# Patient Record
Sex: Male | Born: 2000 | Race: White | Hispanic: No | Marital: Single | Smoking: Never smoker
Health system: Southern US, Community
[De-identification: ages and names within clinical notes are randomized; demographics above are authoritative.]

## PROBLEM LIST (undated history)

## (undated) DIAGNOSIS — G43909 Migraine, unspecified, not intractable, without status migrainosus: Secondary | ICD-10-CM

## (undated) HISTORY — PX: HYPOSPADIAS CORRECTION: SHX483

---
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
## 2018-02-20 ENCOUNTER — Encounter: Payer: Self-pay | Admitting: Family Medicine

## 2018-02-20 ENCOUNTER — Ambulatory Visit (INDEPENDENT_AMBULATORY_CARE_PROVIDER_SITE_OTHER): Payer: 59 | Admitting: Family Medicine

## 2018-02-20 VITALS — BP 121/82 | HR 67 | Wt 291.0 lb

## 2018-02-20 DIAGNOSIS — M79672 Pain in left foot: Secondary | ICD-10-CM | POA: Diagnosis not present

## 2018-02-20 DIAGNOSIS — S92335G Nondisplaced fracture of third metatarsal bone, left foot, subsequent encounter for fracture with delayed healing: Secondary | ICD-10-CM

## 2018-02-20 DIAGNOSIS — S92245G Nondisplaced fracture of medial cuneiform of left foot, subsequent encounter for fracture with delayed healing: Secondary | ICD-10-CM

## 2018-02-20 MED ORDER — DULOXETINE HCL 30 MG PO CPEP: 30.0000 mg | ORAL_CAPSULE | Freq: Every day | ORAL | 2 refills | Status: DC

## 2018-02-20 NOTE — Progress Notes (Signed)
Jamie Kelly is a 17 y.o. male who presents to [facility] today for left foot pain.   Jamie Kelly is a long history of left foot pain starting in August 2018.  A foot landed on the dorsal aspect of his foot resulting in impact injury.  X-rays were initially negative.  Eventually in October an MRI showed fracture of the third metatarsal and medial cuneiform.  He continued to have immobilization until his MRI was rechecked and January showing healing.  He continued to have pain and skin changes and I was suspicious for complex regional pain syndrome.  He had some benefit with physical therapy and adjunct medications including gabapentin and nortriptyline.  Since I last saw him April 2019 he plateaued with physical therapy and stopped going in May.  Additionally he notes that he finds the nortriptyline obnoxious and the gabapentin not very helpful in the long run.  He continues to experience pain in his foot especially with activity.  His pain is limiting his ability to work normally.     ROS:  As above  Exam:  BP 121/82   Pulse 67   Wt 291 lb (132 kg)   BMI 39.47 kg/m  General: Well Developed, well nourished, and in no acute distress.  Neuro/Psych: Alert and oriented x3, extra-ocular muscles intact, able to move all 4 extremities, sensation grossly intact. Skin: Warm and dry, no rashes noted.  Respiratory: Not using accessory muscles, speaking in full sentences, trachea midline.  Cardiovascular: Pulses palpable, no extremity edema. Abdomen: Does not appear distended. Left foot: no swelling, bruising or abrasions.  Tender to palpation over the dorsal foot 2+DP pulse Antalgic gait present. No significant skin changes present today.    Lab and Radiology Results EXAM: MRI OF THE LEFT FOOT WITHOUT CONTRAST  TECHNIQUE: Multiplanar, multisequence MR imaging of the left forefoot was performed. No intravenous contrast was  administered.  COMPARISON:  Radiographs 08/01/2017.  MRI 05/16/2017.  FINDINGS: Bones/Joint/Cartilage  Healing fractures of the medial cuneiform and 3rd metatarsal base. Nondisplaced fracture lines remain within the medial cuneiform with improved surrounding edema. The 3rd metatarsal fracture has completely healed. No significant residual signal abnormalities within the 2nd metatarsal base or middle cuneiform. No acute osseous findings. No significant joint effusions.  Ligaments  As on previous study, the intraosseous portion of the Lisfranc ligament is not well visualized and may be torn. The alignment remains anatomic at the Lisfranc joint.  Muscles and Tendons  The forefoot muscles and tendons appear normal. No significant tenosynovitis.  Soft tissues  No focal soft tissue abnormalities.  IMPRESSION: 1. Healing fractures of the medial cuneiform and 3rd metatarsal base. There is some residual marrow edema in the medial cuneiform which may indicate incomplete healing. No fracture displacement identified. 2. No new osseous findings. 3. Poor visualization of the intraosseous component of the Lisfranc ligament as on previous study. No demonstrated subluxation at the Lisfranc joint.   Electronically Signed   By: Carey BullocksWilliam  Veazey M.D.   On: 08/21/2017 10:23 .xrya   Assessment and Plan: 17 y.o. male with left foot pain. Been ongoing now for almost a year.  Patient was treated conservatively and adequately for typical injury pattern.  However despite immobilization and physical therapy he continues to have significant quality of life limiting pain.  At last imaging check in July 2019 he had healing on MRI.  He continues to have significant pain now 6 months later.  I am not sure exactly why he continues  to hurt.  It is possible he does have complex regional pain syndrome versus other etiology.  I think it is reasonable to get a second opinion.  I recommend  referral to speak surgery specializing in foot and ankle surgery for second opinion and repeat evaluation.  Is possible missing something.  As I anticipate orthopedics will get x-rays regardless if I get them today I think is reasonable to hold off on x-rays and await orthopedic x-ray imaging.  I think is also reasonable or likely that he will get a three-phase bone scan to rule in or out complex regional pain syndrome.  However I do think a second opinion makes a lot of sense at this point.  Is also reasonable to continue to try other medications.  We will discontinue nortriptyline and gabapentin and do a trial of Cymbalta.  Recheck as needed.  I spent 25 minutes with this patient, greater than 50% was face-to-face time counseling regarding ddx and plan.    Orders Placed This Encounter  Procedures  . Ambulatory referral to Orthopedic Surgery    Referral Priority:   Routine    Referral Type:   Surgical    Referral Reason:   Specialty Services Required    Requested Specialty:   Orthopedic Surgery    Number of Visits Requested:   1   Meds ordered this encounter  Medications  . DULoxetine (CYMBALTA) 30 MG capsule    Sig: Take 1 capsule (30 mg total) by mouth daily.    Dispense:  30 capsule    Refill:  2    Historical information moved to improve visibility of documentation.  No past medical history on file. No past surgical history on file. Social History   Tobacco Use  . Smoking status: Never Smoker  . Smokeless tobacco: Never Used  Substance Use Topics  . Alcohol use: No   family history is not on file.  Medications: Current Outpatient Medications  Medication Sig Dispense Refill  . DULoxetine (CYMBALTA) 30 MG capsule Take 1 capsule (30 mg total) by mouth daily. 30 capsule 2   No current facility-administered medications for this visit.    No Known Allergies    Discussed warning signs or symptoms. Please see discharge instructions. Patient expresses understanding.  I  personally was present and performed or re-performed the history, physical exam and medical decision-making activities of this service and have verified that the service and findings are accurately documented in the student's note. ___________________________________________ Clementeen Graham M.D., ABFM., CAQSM. Primary Care and Sports Medicine Adjunct Instructor of Family Medicine  [employer]

## 2018-02-20 NOTE — Patient Instructions (Signed)
Thank you for coming in today. Use cymblata daily.  Follow up with Orthopedics for 2nd opinion.  Recheck as needed.  Let me know if cymbalta helps.   Try baclofen for cramps.

## 2018-06-14 ENCOUNTER — Other Ambulatory Visit: Payer: Self-pay | Admitting: Orthopedic Surgery

## 2018-06-14 DIAGNOSIS — S92245K Nondisplaced fracture of medial cuneiform of left foot, subsequent encounter for fracture with nonunion: Secondary | ICD-10-CM

## 2018-06-19 ENCOUNTER — Ambulatory Visit
Admission: RE | Admit: 2018-06-19 | Discharge: 2018-06-19 | Disposition: A | Discharge status: Home / Self Care | Payer: 59 | Source: Ambulatory Visit | Attending: Orthopedic Surgery | Admitting: Orthopedic Surgery

## 2018-06-19 DIAGNOSIS — S92245K Nondisplaced fracture of medial cuneiform of left foot, subsequent encounter for fracture with nonunion: Secondary | ICD-10-CM

## 2018-07-06 ENCOUNTER — Telehealth: Payer: Self-pay | Admitting: Family Medicine

## 2018-07-06 NOTE — Telephone Encounter (Signed)
Received documentation from Dr[facility] Victorino [facility]ewitt [facility]  Patient continues to experience foot pain and had a recent CT scan of his foot showing a healed medial cuneiform fracture with no evidence of nonunion.  No degenerative changes some irregularity at medial cuneiform.  Plan for diagnostic and therapeutic injection.  Note will be sent to scan.

## 2018-07-31 IMAGING — MR MR FOOT*L* W/O CM
5 series · 40 of 40 positions shown · non-contrast
Comparison: Left foot x-rays dated May 05, 2017.

CLINICAL DATA: Left foot pain since a brick wall fell on his foot
and [place]. Evaluate for Lisfranc injury.

EXAM:
MRI OF THE LEFT FOOT WITHOUT CONTRAST
TECHNIQUE: Multiplanar, multisequence MR imaging of the left foot was
performed. No intravenous contrast was administered.

[Series 4: T1 · coronal · 3.5mm · 0.56mm/px · 11 of 53 slices shown (1 of 2)]
[im 1/53]
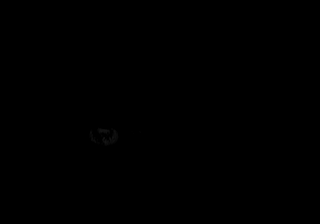
[im 6/53]
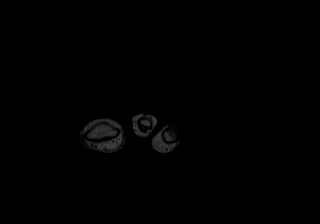
[im 11/53]
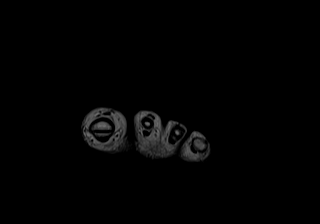
[im 16/53]
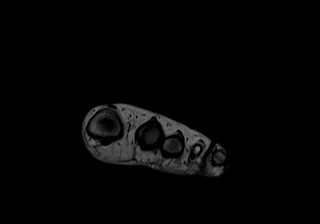
[im 21/53]
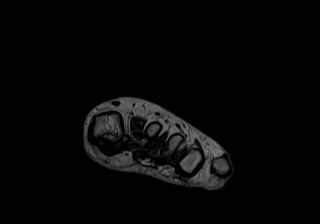
[im 27/53]
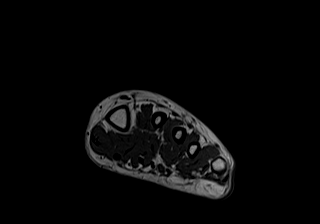
[im 32/53]
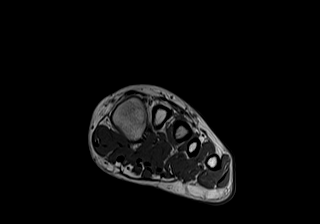
[im 37/53]
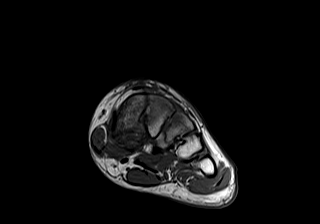
[im 42/53]
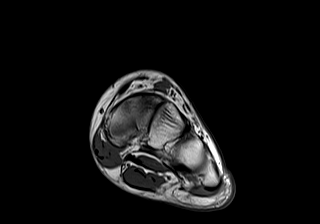
[im 47/53]
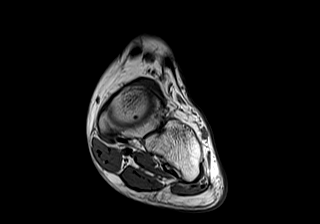
[im 53/53]
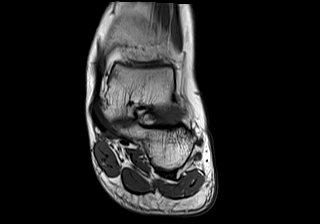

[Series 5: T2 fat-sat · coronal · 3.5mm · 0.56mm/px · 11 of 53 slices shown (1 of 3)]
[im 1/53]
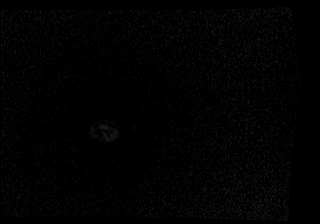
[im 6/53]
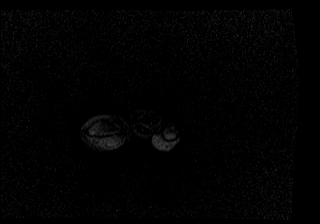
[im 11/53]
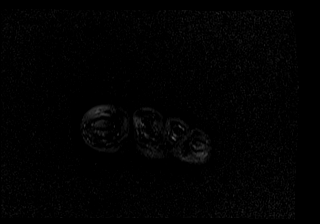
[im 16/53]
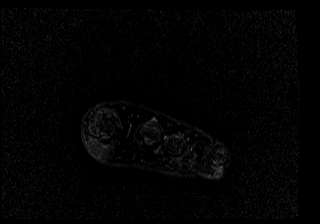
[im 21/53]
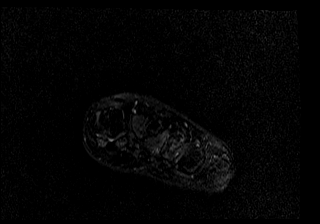
[im 27/53]
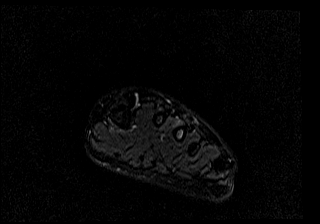
[im 32/53]
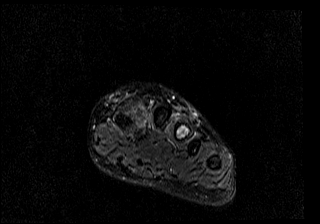
[im 37/53]
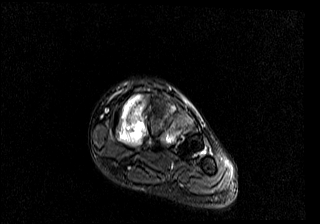
[im 42/53]
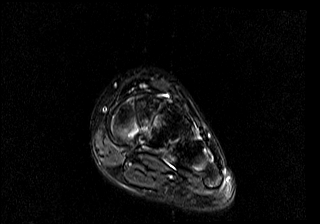
[im 47/53]
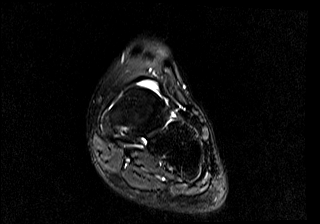
[im 53/53]
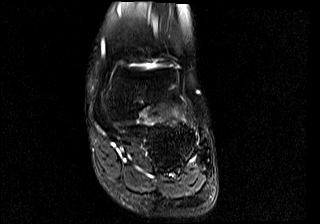

[Series 6: T1 · axial · 3.0mm · 0.62mm/px · z∈[-98,-13]mm · 6 of 28 slices shown (2 of 2)]
[im 1/28]
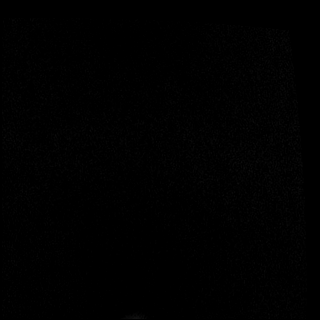
[im 6/28]
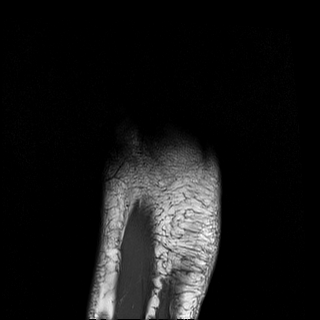
[im 11/28]
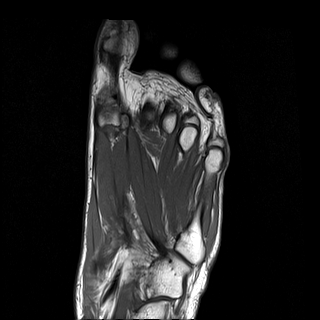
[im 17/28]
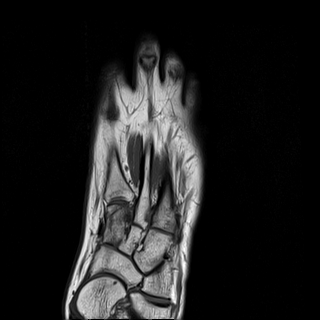
[im 22/28]
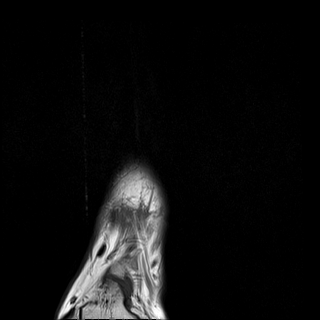
[im 28/28]
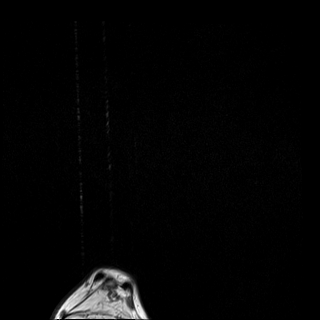

[Series 7: T2 fat-sat · axial · 3.0mm · 0.78mm/px · z∈[-98,-13]mm · 6 of 28 slices shown (2 of 3)]
[im 1/28]
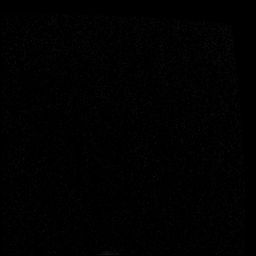
[im 6/28]
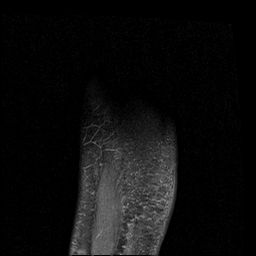
[im 11/28]
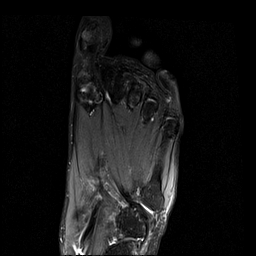
[im 17/28]
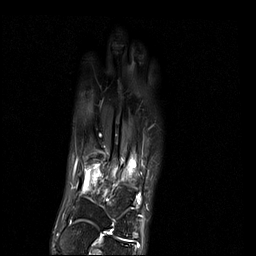
[im 22/28]
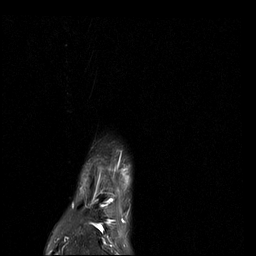
[im 28/28]
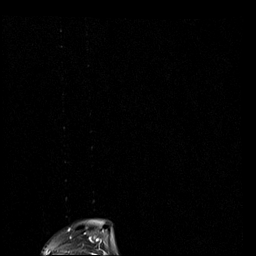

[Series 8: T2 fat-sat · sagittal · 3.0mm · 0.39mm/px · 6 of 31 slices shown (3 of 3)]
[im 1/31]
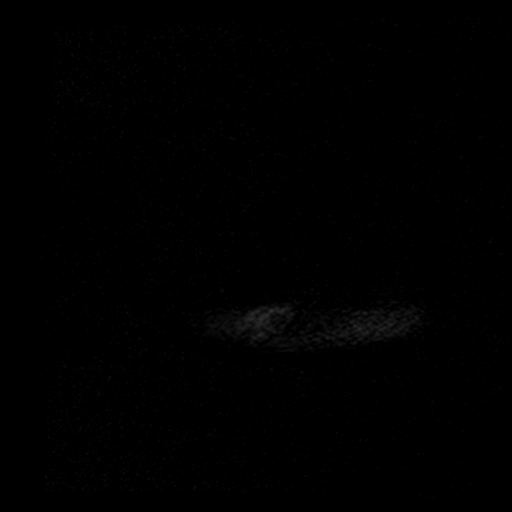
[im 7/31]
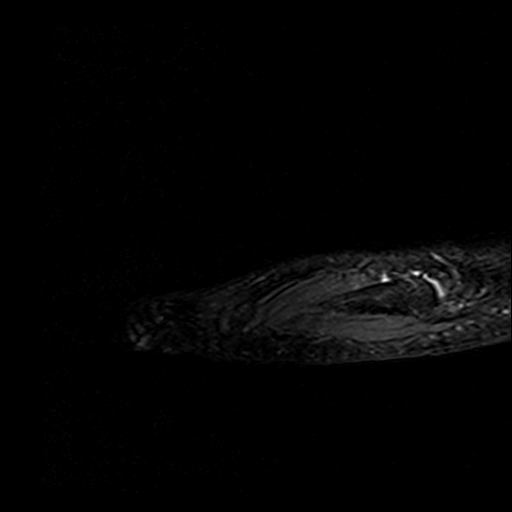
[im 13/31]
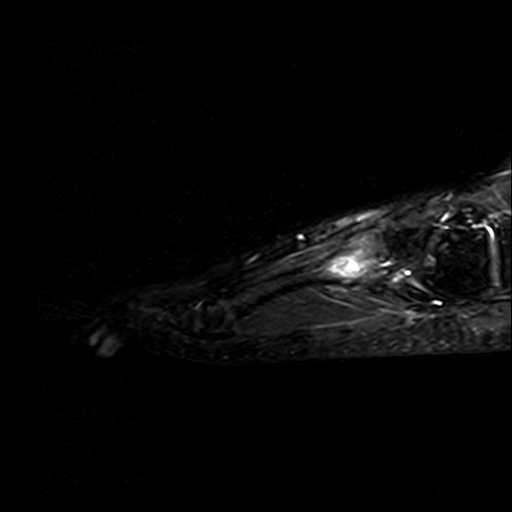
[im 19/31]
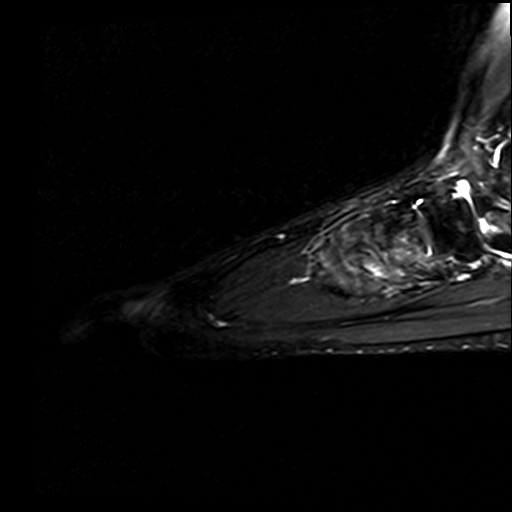
[im 25/31]
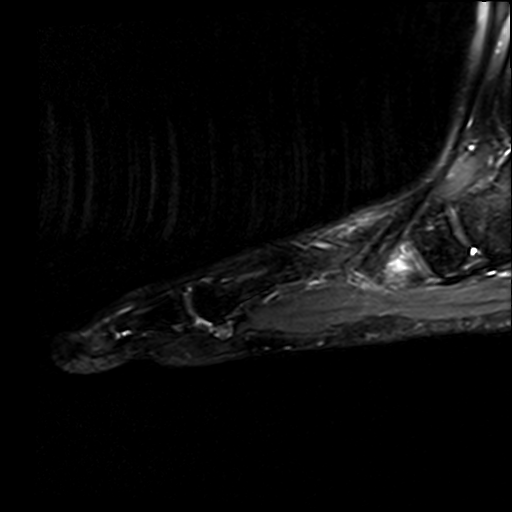
[im 31/31]
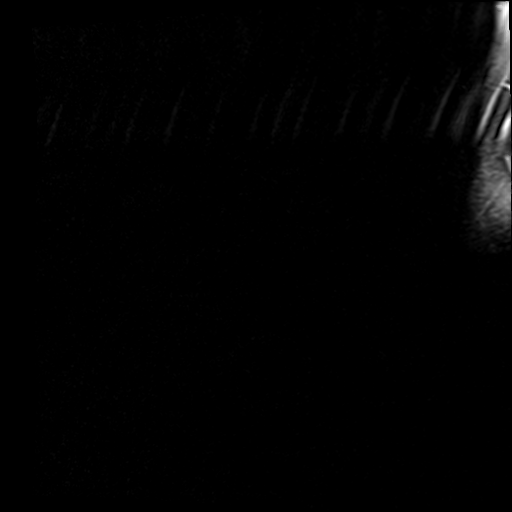

[40 of 40 positions shown; findings below may reference images not displayed]

FINDINGS: Bones/Joint/Cartilage

There is a nondisplaced fracture of the medial cuneiform with
surrounding marrow edema. There is also a nondisplaced fracture at
the base of the third metatarsal with surrounding marrow edema. Mild
marrow edema in the base of the second metatarsal and middle
cuneiform may be stress related or secondary to resolving
contusions. Linear T1 hypointense, T2 hyperintense line at the base
of the first metatarsal without surrounding marrow edema likely
represents an incompletely fused physis.

Ligaments

The dorsal component of the Lisfranc ligament appears intact. A
normal interosseous component is not well visualized and may be
torn.

Muscles and Tendons

No muscle atrophy or edema. The visualized flexor and extensor
tendons are intact.

Soft tissues

Unremarkable.
IMPRESSION: 1. Nondisplaced fractures of the medial cuneiform and base of the
third metatarsal.
2. Mild marrow edema in the middle cuneiform and base of the second
metatarsal may be stress related or resolving contusions.
3. Poor visualization of the Lisfranc ligament interosseous
component, which may be torn. The dorsal component appears intact.

## 2018-10-16 IMAGING — DX DG FOOT COMPLETE 3+V*L*
3 series · 3 of 3 positions shown · non-contrast
Comparison: Multiple films a most recent of which is 05/05/2017

CLINICAL DATA: Persistent foot pain

EXAM:
LEFT FOOT - COMPLETE 3+ VIEW

[foot ap]
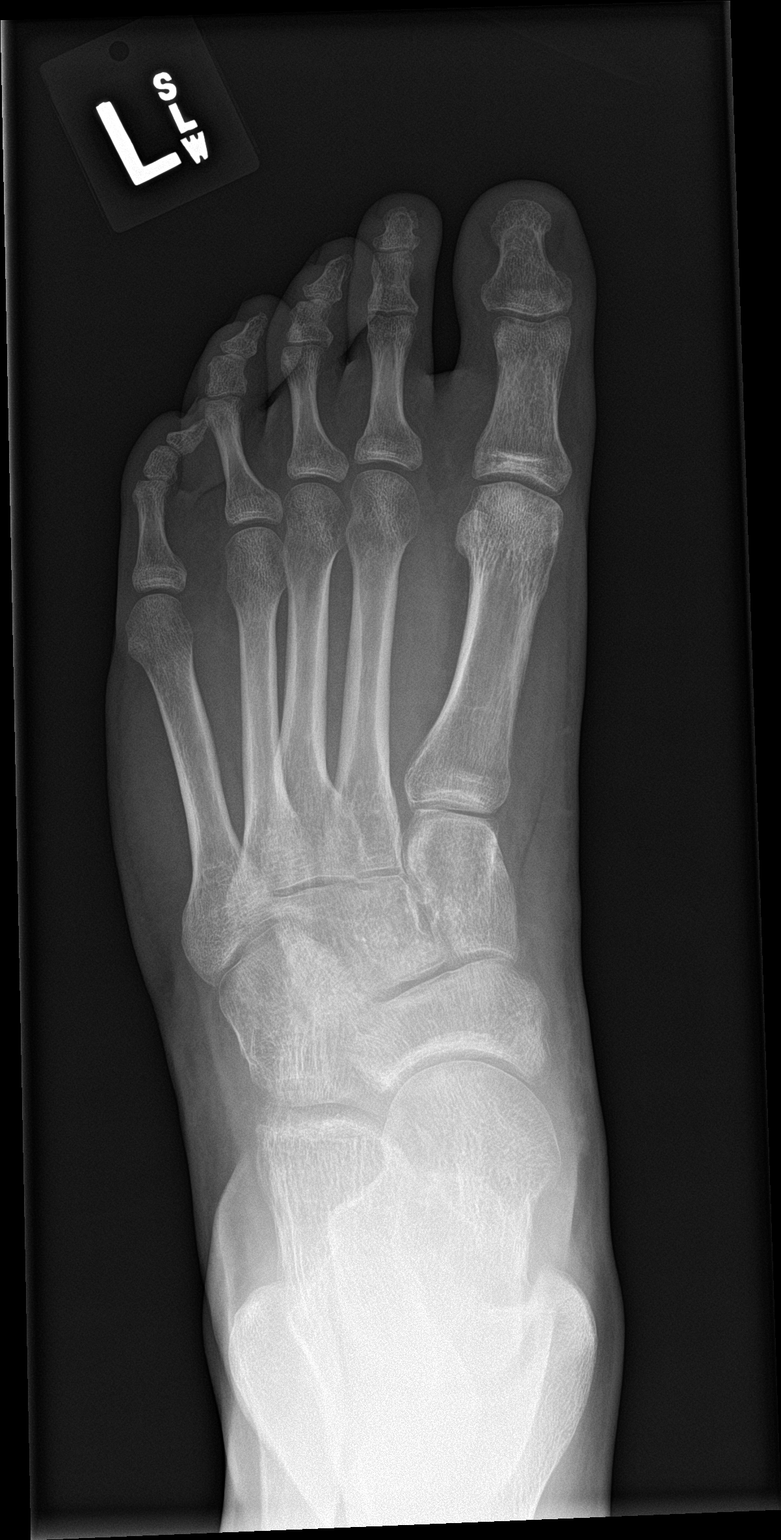

[foot obl]
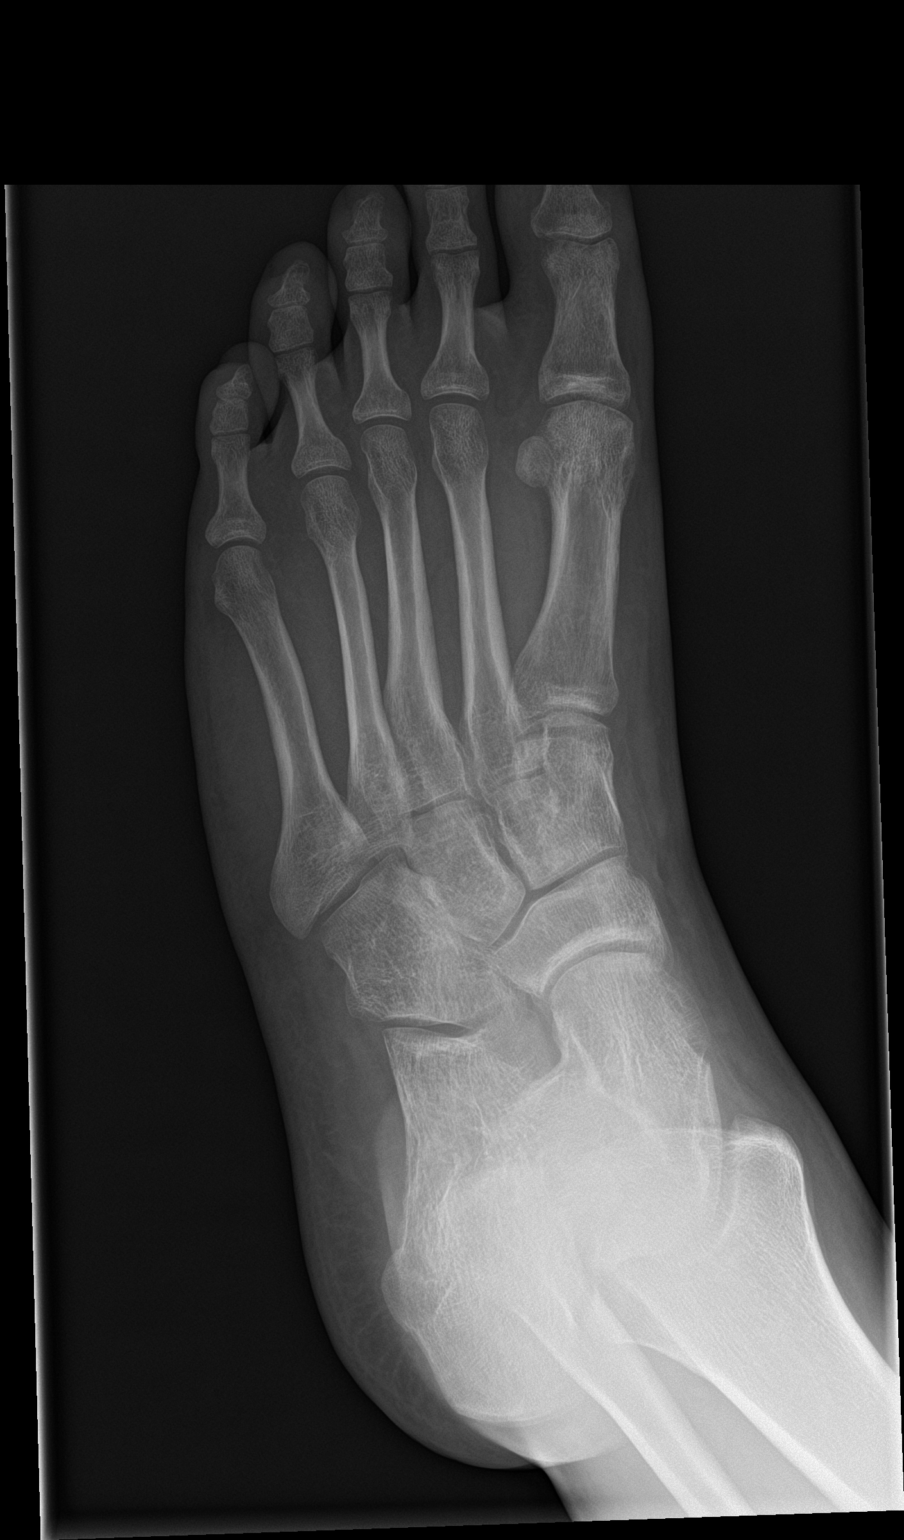

[foot lat]
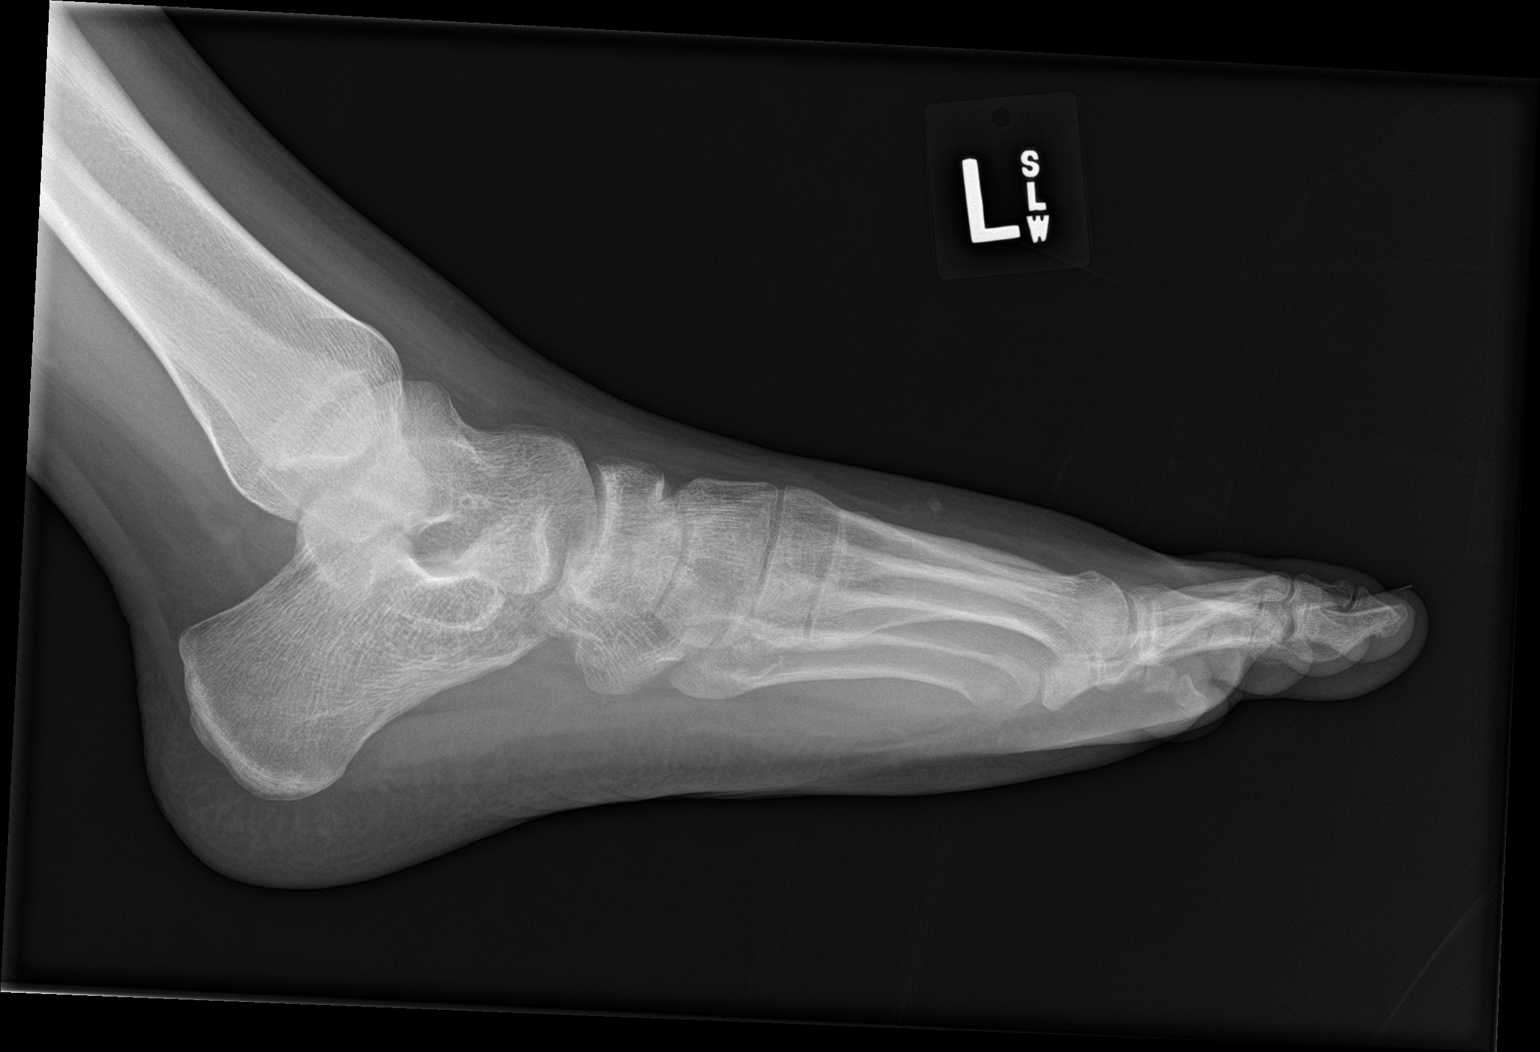

[3 of 3 positions shown; findings below may reference images not displayed]

FINDINGS: The previously seen medial cuneiform and third metatarsal fractures
are not well appreciated on this exam. Mild flattening of the
plantar arch is noted. No other focal abnormality is seen.
IMPRESSION: Previously seen fractures are not well visualized on today's exam
consistent with healing. No acute abnormality noted.

## 2021-01-26 ENCOUNTER — Emergency Department (INDEPENDENT_AMBULATORY_CARE_PROVIDER_SITE_OTHER)
Admission: EM | Admit: 2021-01-26 | Discharge: 2021-01-26 | Disposition: A | Discharge status: Home / Self Care | Payer: 59 | Source: Home / Self Care | Attending: Family Medicine | Admitting: Family Medicine

## 2021-01-26 ENCOUNTER — Other Ambulatory Visit: Payer: Self-pay

## 2021-01-26 DIAGNOSIS — R519 Headache, unspecified: Secondary | ICD-10-CM

## 2021-01-26 HISTORY — DX: Migraine, unspecified, not intractable, without status migrainosus: G43.909

## 2021-01-26 MED ORDER — KETOROLAC TROMETHAMINE 30 MG/ML IJ SOLN
30.0000 mg | Freq: Once | INTRAMUSCULAR | Status: AC
  Administered 2021-01-26: 15:00:00 30 mg via INTRAMUSCULAR

## 2021-01-26 MED ORDER — DEXAMETHASONE SODIUM PHOSPHATE 10 MG/ML IJ SOLN
10.0000 mg | Freq: Once | INTRAMUSCULAR | Status: AC
  Administered 2021-01-26: 15:00:00 10 mg via INTRAMUSCULAR

## 2021-01-26 MED ORDER — IBUPROFEN 800 MG PO TABS: 800.0000 mg | ORAL_TABLET | Freq: Three times a day (TID) | ORAL | 0 refills | Status: AC | PRN

## 2021-01-26 NOTE — ED Triage Notes (Addendum)
Pt presents to Urgent Care with c/o migraine HA x 3 days. Reports pain is directly above R eyebrow. Intermittent relief w/ ibuprofen. Reports his R eye hurts and vision is blurry. Grips are equal and strong, smile symmetrical, and tongue is midline.

## 2021-01-26 NOTE — Discharge Instructions (Addendum)
I have given you medication for migraine.  I have included medication for sinus pressure. Go home and rest in a quiet dark room. I expect you to see improvement within a couple of hours If you are worse instead of better, or have new symptoms please call or return

## 2021-01-26 NOTE — ED Provider Notes (Signed)
[name]    CSN: [id number] Arrival date & time: 01/26/21  1312      History   Chief Complaint Chief Complaint  Patient presents with   Migraine    HPI Jamie Kelly is a 20 y.o. male.   HPI Patient has a history of migraines.  He has a migraine currently for the last few days.  He states that normally they are controlled with ibuprofen 800 mg.  This time he does have some photophobia.  No nausea or vomiting.  No visual changes.  No aura.  States right eye feels "foggy" off and on.  States vision is normal now.  He has had no head injury.  He has had no recent infection, cough or cold.  He has no numbness or weakness in arms or legs.  No problems with gait or balance.  Past Medical History:  Diagnosis Date   Migraines     Patient Active Problem List   Diagnosis Date Noted   CRPS (complex regional pain syndrome type I) 07/15/2017   Closed nondisplaced fracture of medial cuneiform of left foot 05/17/2017   Closed nondisplaced fracture of third metatarsal bone of left foot 05/17/2017   Left foot pain 05/05/2017    Past Surgical History:  Procedure Laterality Date   HYPOSPADIAS CORRECTION         Home Medications    Prior to Admission medications   Medication Sig Start Date End Date Taking? Authorizing Provider  ibuprofen (ADVIL) 800 MG tablet Take 1 tablet (800 mg total) by mouth every 8 (eight) hours as needed for headache. 01/26/21   Eustace Moore, MD    Family History Family History  Problem Relation Age of Onset   Healthy Mother    Healthy Father     Social History Social History   Tobacco Use   Smoking status: Never   Smokeless tobacco: Never  Vaping Use   Vaping Use: Never used  Substance Use Topics   Alcohol use: Yes    Comment: occasionally     Allergies   Patient has no known allergies.   Review of Systems Review of Systems See HPI  Physical Exam Triage Vital Signs ED Triage Vitals  Enc Vitals Group     BP  01/26/21 1412 119/79     Pulse Rate 01/26/21 1412 (!) 58     Resp 01/26/21 1412 20     Temp 01/26/21 1412 97.9 F (36.6 C)     Temp Source 01/26/21 1412 Oral     SpO2 01/26/21 1412 98 %     Weight 01/26/21 1407 260 lb (117.9 kg)     Height 01/26/21 1407 6' (1.829 m)     Head Circumference --      Peak Flow --      Pain Score 01/26/21 1407 5     Pain Loc --      Pain Edu? --      Excl. in GC? --    No data found.  Updated Vital Signs BP 119/79   Pulse (!) 58   Temp 97.9 F (36.6 C) (Oral)   Resp 20   Ht 6' (1.829 m)   Wt 117.9 kg   SpO2 98%   BMI 35.26 kg/m       Physical Exam Constitutional:      General: He is not in acute distress.    Appearance: He is well-developed. He is obese.  HENT:     Head: Normocephalic and atraumatic.  Right Ear: Tympanic membrane and ear canal normal.     Left Ear: Tympanic membrane and ear canal normal.     Nose: Nose normal. No congestion.     Comments: Mild tenderness over right ethmoid and frontal sinuses.  No sinus symptoms such as drainage or congestion    Mouth/Throat:     Mouth: Mucous membranes are moist.     Pharynx: No posterior oropharyngeal erythema.  Eyes:     Extraocular Movements: Extraocular movements intact.     Conjunctiva/sclera: Conjunctivae normal.     Pupils: Pupils are equal, round, and reactive to light.     Comments: Funduscopic exam is normal  Cardiovascular:     Rate and Rhythm: Normal rate and regular rhythm.     Heart sounds: Normal heart sounds.  Pulmonary:     Effort: Pulmonary effort is normal. No respiratory distress.     Breath sounds: Normal breath sounds.  Abdominal:     General: There is no distension.     Palpations: Abdomen is soft.  Musculoskeletal:        General: Normal range of motion.     Cervical back: Normal range of motion.  Skin:    General: Skin is warm and dry.  Neurological:     General: No focal deficit present.     Mental Status: He is alert. Mental status is at  baseline.     Cranial Nerves: No cranial nerve deficit.     Motor: No weakness.     Coordination: Coordination normal.     Gait: Gait normal.     Deep Tendon Reflexes: Reflexes normal.  Psychiatric:        Mood and Affect: Mood normal.        Behavior: Behavior normal.     UC Treatments / Results  Labs (all labs ordered are listed, but only abnormal results are displayed) Labs Reviewed - No data to display  EKG   Radiology No results found.  Procedures Procedures (including critical care time)  Medications Ordered in UC Medications  ketorolac (TORADOL) 30 MG/ML injection 30 mg (30 mg Intramuscular Given 01/26/21 1526)  dexamethasone (DECADRON) injection 10 mg (10 mg Intramuscular Given 01/26/21 1527)    Initial Impression / Assessment and Plan / UC Course  I have reviewed the triage vital signs and the nursing notes.  Pertinent labs & imaging results that were available during my care of the patient were reviewed by me and considered in my medical decision making (see chart for details).     We will treat for migraine.  I am adding dexamethasone for the sinus pressure.  He needs to come back if not better by tomorrow.  Normal neuro exam Final Clinical Impressions(s) / UC Diagnoses   Final diagnoses:  Bad headache  Sinus headache     Discharge Instructions      I have given you medication for migraine.  I have included medication for sinus pressure. Go home and rest in a quiet dark room. I expect you to see improvement within a couple of hours If you are worse instead of better, or have new symptoms please call or return   ED Prescriptions     Medication Sig Dispense Auth. Provider   ibuprofen (ADVIL) 800 MG tablet Take 1 tablet (800 mg total) by mouth every 8 (eight) hours as needed for headache. 30 tablet Eustace Moore, MD      PDMP not reviewed this encounter.   Eustace Moore, MD  01/26/21 1821  

## 2021-01-29 ENCOUNTER — Ambulatory Visit (INDEPENDENT_AMBULATORY_CARE_PROVIDER_SITE_OTHER): Payer: 59 | Admitting: Family Medicine

## 2021-01-29 ENCOUNTER — Other Ambulatory Visit: Payer: Self-pay

## 2021-01-29 ENCOUNTER — Encounter: Payer: Self-pay | Admitting: Family Medicine

## 2021-01-29 DIAGNOSIS — R519 Headache, unspecified: Secondary | ICD-10-CM | POA: Insufficient documentation

## 2021-01-29 MED ORDER — PREDNISONE 50 MG PO TABS: ORAL_TABLET | ORAL | 0 refills | Status: DC

## 2021-01-29 MED ORDER — AMOXICILLIN-POT CLAVULANATE 875-125 MG PO TABS: 1.0000 | ORAL_TABLET | Freq: Two times a day (BID) | ORAL | 0 refills | Status: DC

## 2021-01-29 NOTE — Progress Notes (Signed)
Jamie Kelly - 20 y.o. male MRN [id number]  Date of birth: 04/16/2001  Subjective No chief complaint on file.   HPI Jamie Kelly is a 20 y.o. male here today for initial visit to establish care.  He has complaint of headache.  Seen at urgent care recently for same complaint.  He was given toradol and decadron and sent home with Ibuprofen.  He reports that symptoms improved but returned a couple of days later.  Pain is located around and above the R eye, along the frontal sinus.  He does have some mild congestion related to allergies.  He denies fever, chills, nausea or vomiting.  He does have some sound sensitivity related to headache.   ROS:  A comprehensive ROS was completed and negative except as noted per HPI  No Known Allergies  Past Medical History:  Diagnosis Date   Migraines     Past Surgical History:  Procedure Laterality Date   HYPOSPADIAS CORRECTION      Social History   Socioeconomic History   Marital status: Single    Spouse name: Not on file   Number of children: Not on file   Years of education: Not on file   Highest education level: Not on file  Occupational History   Not on file  Tobacco Use   Smoking status: Never   Smokeless tobacco: Never  Vaping Use   Vaping Use: Never used  Substance and Sexual Activity   Alcohol use: Yes    Comment: occasionally   Drug use: Not on file   Sexual activity: Not Currently  Other Topics Concern   Not on file  Social History Narrative   Not on file   Social Determinants of Health   Financial Resource Strain: Not on file  Food Insecurity: Not on file  Transportation Needs: Not on file  Physical Activity: Not on file  Stress: Not on file  Social Connections: Not on file    Family History  Problem Relation Age of Onset   Healthy Mother    Healthy Father    Diabetes Maternal Grandmother     Health Maintenance  Topic Date Due   COVID-19 Vaccine (1) Never done   HPV VACCINES (1 - Male 2-dose series)  Never done   HIV Screening  Never done   Hepatitis C Screening  Never done   TETANUS/TDAP  Never done   INFLUENZA VACCINE  03/02/2021   Pneumococcal Vaccine 74-62 Years old  Aged Out     ----------------------------------------------------------------------------------------------------------------------------------------------------------------------------------------------------------------- Physical Exam BP 122/80   Pulse 71   Ht 6' (1.829 m)   Wt (!) 326 lb (147.9 kg)   BMI 44.21 kg/m   Physical Exam Constitutional:      Appearance: Normal appearance.  HENT:     Head: Normocephalic.     Right Ear: Tympanic membrane normal.     Left Ear: Tympanic membrane normal.     Nose:     Comments: TTP along the R brow and frontal sinus.   Eyes:     General: No scleral icterus.    Extraocular Movements: Extraocular movements intact.     Pupils: Pupils are equal, round, and reactive to light.  Cardiovascular:     Rate and Rhythm: Normal rate and regular rhythm.  Pulmonary:     Effort: Pulmonary effort is normal.     Breath sounds: Normal breath sounds.  Musculoskeletal:     Cervical back: Neck supple.  Neurological:     General: No focal deficit present.  Mental Status: He is alert and oriented to person, place, and time.     Cranial Nerves: No cranial nerve deficit.     Sensory: No sensory deficit.     Motor: No weakness.    ------------------------------------------------------------------------------------------------------------------------------------------------------------------------------------------------------------------- Assessment and Plan  Headache Possible sinus etiology.  Starting course of prednisone and antibiotic coverage.  If not improving with this we discussed possible imaging and/or referral to neurology.   Meds ordered this encounter  Medications   amoxicillin-clavulanate (AUGMENTIN) 875-125 MG tablet    Sig: Take 1 tablet by mouth 2 (two)  times daily.    Dispense:  20 tablet    Refill:  0   predniSONE (DELTASONE) 50 MG tablet    Sig: Take 50mg  PO x5 days    Dispense:  5 tablet    Refill:  0    No follow-ups on file.    This visit occurred during the SARS-CoV-2 public health emergency.  Safety protocols were in place, including screening questions prior to the visit, additional usage of staff PPE, and extensive cleaning of exam room while observing appropriate contact time as indicated for disinfecting solutions.

## 2021-01-29 NOTE — Assessment & Plan Note (Signed)
Possible sinus etiology.  Starting course of prednisone and antibiotic coverage.  If not improving with this we discussed possible imaging and/or referral to neurology.

## 2021-01-29 NOTE — Patient Instructions (Signed)
Let's try antibiotic and steroid.  Continue ibuprofen as needed.  Follow up if symptoms persist after completion of antibiotic and steroid.

## 2021-07-22 ENCOUNTER — Other Ambulatory Visit: Payer: Self-pay

## 2021-07-22 ENCOUNTER — Emergency Department (HOSPITAL_BASED_OUTPATIENT_CLINIC_OR_DEPARTMENT_OTHER)
Admission: EM | Admit: 2021-07-22 | Discharge: 2021-07-22 | Disposition: A | Discharge status: Home / Self Care | Payer: 59 | Attending: Emergency Medicine | Admitting: Emergency Medicine

## 2021-07-22 ENCOUNTER — Emergency Department (HOSPITAL_BASED_OUTPATIENT_CLINIC_OR_DEPARTMENT_OTHER): Payer: 59

## 2021-07-22 ENCOUNTER — Encounter (HOSPITAL_BASED_OUTPATIENT_CLINIC_OR_DEPARTMENT_OTHER): Payer: Self-pay

## 2021-07-22 DIAGNOSIS — B349 Viral infection, unspecified: Secondary | ICD-10-CM | POA: Diagnosis not present

## 2021-07-22 DIAGNOSIS — R000 Tachycardia, unspecified: Secondary | ICD-10-CM | POA: Insufficient documentation

## 2021-07-22 DIAGNOSIS — Z20822 Contact with and (suspected) exposure to covid-19: Secondary | ICD-10-CM | POA: Insufficient documentation

## 2021-07-22 DIAGNOSIS — R059 Cough, unspecified: Secondary | ICD-10-CM | POA: Diagnosis present

## 2021-07-22 LAB — RESP PANEL BY RT-PCR (FLU A&B, COVID) ARPGX2
Influenza A by PCR: NEGATIVE
Influenza B by PCR: NEGATIVE
SARS Coronavirus 2 by RT PCR: NEGATIVE

## 2021-07-22 MED ORDER — ALBUTEROL SULFATE HFA 108 (90 BASE) MCG/ACT IN AERS
2.0000 | INHALATION_SPRAY | Freq: Once | RESPIRATORY_TRACT | Status: AC
  Administered 2021-07-22: 19:00:00 2 via RESPIRATORY_TRACT
  Filled 2021-07-22: qty 6.7

## 2021-07-22 MED ORDER — ACETAMINOPHEN 325 MG PO TABS
650.0000 mg | ORAL_TABLET | Freq: Once | ORAL | Status: AC
  Administered 2021-07-22: 18:00:00 650 mg via ORAL
  Filled 2021-07-22: qty 2

## 2021-07-22 NOTE — Discharge Instructions (Signed)
Please use the inhaler as needed for feelings of shortness of breath. Continue taking Ibuprofen and Tylenol as needed for fevers/pain. Drink plenty of fluids to stay hydrated and rest as much as possible.   Follow up with your PCP for further evaluation  Return to the ED for any new/worsening symptoms

## 2021-07-22 NOTE — ED Triage Notes (Signed)
Pt arrives with reports of headache starting 4 days ago and feeling short of breath today.

## 2021-07-22 NOTE — ED Provider Notes (Signed)
[facility] EMERGENCY DEPARTMENT Provider Note   CSN: [id number] Arrival date & time: 07/22/21  1650     History Chief Complaint  Patient presents with   Shortness of Breath    Banjamin Kelly is a 20 y.o. male who presents to the ED today with complaint of URI-like symptoms for the past 4 days.  Patient reports that he had a headache early on in his illness which has since resolved.  He now most pacifically complains of shortness of breath which prompted ED visit today.  He has been having a dry cough, subjective fever/chills, body aches, sore throat and diarrhea. He has been taking over-the-counter medications without much relief.  His mom is at bedside and states that patient was recently around sister who had both strep throat and then pneumonia.  Patient is able to drink/eat without difficulty.  Denies any drooling.  No chest pain.   The history is provided by the patient and a parent.      Past Medical History:  Diagnosis Date   Migraines     Patient Active Problem List   Diagnosis Date Noted   Headache 01/29/2021   CRPS (complex regional pain syndrome type I) 07/15/2017   Closed nondisplaced fracture of medial cuneiform of left foot 05/17/2017   Closed nondisplaced fracture of third metatarsal bone of left foot 05/17/2017   Left foot pain 05/05/2017    Past Surgical History:  Procedure Laterality Date   HYPOSPADIAS CORRECTION         Family History  Problem Relation Age of Onset   Healthy Mother    Healthy Father    Diabetes Maternal Grandmother     Social History   Tobacco Use   Smoking status: Never   Smokeless tobacco: Never  Vaping Use   Vaping Use: Never used  Substance Use Topics   Alcohol use: Yes    Comment: occasionally   Drug use: Never    Home Medications Prior to Admission medications   Medication Sig Start Date End Date Taking? Authorizing Provider  amoxicillin-clavulanate (AUGMENTIN) 875-125 MG tablet Take 1 tablet by mouth  2 (two) times daily. 01/29/21   Everrett Coombe, DO  ibuprofen (ADVIL) 800 MG tablet Take 1 tablet (800 mg total) by mouth every 8 (eight) hours as needed for headache. 01/26/21   Eustace Moore, MD  predniSONE (DELTASONE) 50 MG tablet Take 50mg  PO x5 days 01/29/21   01/31/21, DO    Allergies    Patient has no known allergies.  Review of Systems   Review of Systems  Constitutional:  Positive for chills, fatigue and fever.  Respiratory:  Positive for cough and shortness of breath.   Cardiovascular:  Negative for chest pain.  Gastrointestinal:  Positive for diarrhea. Negative for nausea and vomiting.  Neurological:  Positive for headaches.  All other systems reviewed and are negative.  Physical Exam Updated Vital Signs BP 101/76 (BP Location: Right Arm)    Pulse (!) 107    Temp 99.1 F (37.3 C) (Oral)    Resp (!) 24    Ht 6' (1.829 m)    Wt 136.1 kg    SpO2 100%    BMI 40.69 kg/m   Physical Exam Vitals and nursing note reviewed.  Constitutional:      Appearance: He is obese. He is not ill-appearing or diaphoretic.  HENT:     Head: Normocephalic and atraumatic.     Mouth/Throat:     Mouth: Mucous membranes are moist.  Pharynx: No pharyngeal swelling or oropharyngeal exudate.  Eyes:     Conjunctiva/sclera: Conjunctivae normal.  Cardiovascular:     Rate and Rhythm: Regular rhythm. Tachycardia present.  Pulmonary:     Effort: Pulmonary effort is normal.     Breath sounds: Normal breath sounds. No decreased breath sounds, wheezing, rhonchi or rales.     Comments: Speaking in full sentences without difficulty.  Satting 100% on room air.  Lungs clear to auscultation bilaterally. Skin:    General: Skin is warm and dry.     Coloration: Skin is not jaundiced.  Neurological:     Mental Status: He is alert.    ED Results / Procedures / Treatments   Labs (all labs ordered are listed, but only abnormal results are displayed) Labs Reviewed  RESP PANEL BY RT-PCR (FLU A&B,  COVID) [id number]    EKG EKG Interpretation  Date/Time:  Wednesday July 22 2021 17:00:58 EST Ventricular Rate:  110 PR Interval:  150 QRS Duration: 92 QT Interval:  328 QTC Calculation: 443 R Axis:   109 Text Interpretation: Sinus tachycardia Rightward axis Cannot rule out Anterior infarct , age undetermined Abnormal ECG No prior ECG for comparison. ?S1Q3T3 pattern. No STEMI Confirmed by Theda Belfast ([id number]) on 07/22/2021 5:03:57 PM  Radiology DG Chest 2 View  Result Date: 07/22/2021 CLINICAL DATA:  Shortness of breath and cough. EXAM: CHEST - 2 VIEW COMPARISON:  Chest x-ray 09/10/2014. FINDINGS: The heart size and mediastinal contours are within normal limits. Both lungs are clear. The visualized skeletal structures are unremarkable. IMPRESSION: No active cardiopulmonary disease. Electronically Signed   By: Darliss Cheney M.D.   On: 07/22/2021 18:25    Procedures Procedures   Medications Ordered in ED Medications  albuterol (VENTOLIN HFA) 108 (90 Base) MCG/ACT inhaler 2 puff (has no administration in time range)  acetaminophen (TYLENOL) tablet 650 mg (650 mg Oral Given 07/22/21 1821)    ED Course  I have reviewed the triage vital signs and the nursing notes.  Pertinent labs & imaging results that were available during my care of the patient were reviewed by me and considered in my medical decision making (see chart for details).    MDM Rules/Calculators/A&P                          20 year old male who presents to the ED today with URI-like symptoms, shortness of breath, diarrhea for the past 4 days.  On arrival to the ED patient's have is 99.1.  He is mildly tachycardic in the 110s.  Mildly tachypneic initially however on my exam he is resting comfortably and breathing with regular respirations.  He is satting 100% on room air.  He is speaking full sentences without difficulty and his lungs are clear to auscultation bilaterally.  He did have a COVID and flu test done while  in the waiting room which is returned negative at this time.  He would complaint of shortness of breath we will plan for chest x-ray at this time however I do suspect he is suffering from another viral illness.  Tylenol provided for slight elevation in temperature which I suspect is likely causing his mild tachycardia as well.  CXR clear without signs of infection Will discharge home at this time with inhaler for symptomatic relief. Pt instructed to drink plenty of fluids to stay hydrated and rest as much as possible. He is instructed to follow up with PCP for further eval. Pt and mom  are in agreement with plan.   This note was prepared using Dragon voice recognition software and may include unintentional dictation errors due to the inherent limitations of voice recognition software.      Final Clinical Impression(s) / ED Diagnoses Final diagnoses:  Viral illness    Rx / DC Orders ED Discharge Orders     None        Discharge Instructions      Please use the inhaler as needed for feelings of shortness of breath. Continue taking Ibuprofen and Tylenol as needed for fevers/pain. Drink plenty of fluids to stay hydrated and rest as much as possible.   Follow up with your PCP for further evaluation  Return to the ED for any new/worsening symptoms       Tanda Rockers, PA-C 07/22/21 1911    Tegeler, Canary Brim, MD 07/22/21 [phone]

## 2021-07-24 ENCOUNTER — Telehealth: Payer: Self-pay | Admitting: General Practice

## 2021-07-24 NOTE — Telephone Encounter (Signed)
Transition Care Management Unsuccessful Follow-up Telephone Call  Date of discharge and from where:  07/22/21 From [facility]  Attempts:  1st Attempt  Reason for unsuccessful TCM follow-up call:  Left voice message

## 2021-07-29 NOTE — Telephone Encounter (Signed)
Transition Care Management Unsuccessful Follow-up Telephone Call  Date of discharge and from where:  07/22/21 from [facility]  Attempts:  2nd Attempt  Reason for unsuccessful TCM follow-up call:  Left voice message

## 2021-07-31 NOTE — Telephone Encounter (Signed)
Transition Care Management Unsuccessful Follow-up Telephone Call  Date of discharge and from where:  07/22/21 from [facility]  Attempts:  3rd Attempt  Reason for unsuccessful TCM follow-up call:  Left voice message

## 2022-06-02 ENCOUNTER — Telehealth: Payer: Self-pay | Admitting: General Practice

## 2022-06-02 NOTE — Telephone Encounter (Signed)
Transition Care Management Follow-up Telephone Call Date of discharge and from where: 05/30/22 from [place] How have you been since you were released from the hospital? Patient's mother stated that the patient is doing ok. He dislocated his shoulder and can't get an MRI until June 11, 2022. He has an appointment with ortho is July 05, 2022. They will call to see if they can get an appointment sooner or move the appointment to Dr. Darene Lamer. Any questions or concerns? No  Items Reviewed: Did the pt receive and understand the discharge instructions provided? Yes  Medications obtained and verified? No  Other? No  Any new allergies since your discharge? No  Dietary orders reviewed? Yes Do you have support at home? Yes   Home Care and Equipment/Supplies: Were home health services ordered? no  Functional Questionnaire: (I = Independent and D = Dependent) ADLs: I  Bathing/Dressing- I  Meal Prep- I  Eating- I  Maintaining continence- I  Transferring/Ambulation- I  Managing Meds- I  Follow up appointments reviewed:  PCP Hospital f/u appt confirmed? No   Specialist Hospital f/u appt confirmed? Yes  Scheduled to see the ortho on 07/05/22. Are transportation arrangements needed? No  If their condition worsens, is the pt aware to call PCP or go to the Emergency Dept.? Yes Was the patient provided with contact information for the PCP's office or ED? Yes Was to pt encouraged to call back with questions or concerns? Yes

## 2024-07-27 ENCOUNTER — Ambulatory Visit: Payer: Self-pay

## 2024-07-27 NOTE — Telephone Encounter (Signed)
 FYI Only or Action Required?: FYI only for provider: appointment scheduled on 08/01/24.  Patient was last seen in primary care on No longer established with [facility]. Last appt was on 01/21/21 with Dr. Alvia.  Called Nurse Triage reporting Leg Pain.  Symptoms began several weeks ago.  Interventions attempted: OTC medications: ibuprofen , bengay and Rest, hydration, or home remedies.  Symptoms are: gradually worsening.  Triage Disposition: See PCP When Office is Open (Within 3 Days)  Patient/caregiver understands and will follow disposition?: Yes   Pain in left hip that travels down front of left leg only with certain movements x3 weeks. Previously was intermittent, has gotten worse and now constant as of Sunday. Constant 3/10 pain, can get up to 10/10 intermittently with certain movements. Aching or shooting pain. Denies fever or redness or numbness. No recent injury or strain. Able to ambulate. No changes to bowel or bladder control. Advised to be seen in next 3 days, pt no longer established with [facility] PCP. Scheduled soonest NP appt for 12/31 at clinic in pt region. Advised UC in the meantime if pt would like to be seen sooner.    Copied from CRM [id number]. Topic: Clinical - Red Word Triage >> Jul 27, 2024  8:09 AM Jamie Kelly wrote: Red Word that prompted transfer to Nurse Triage:  Left hip pain going on for a couple of weeks Pain pain radiates and shooting pain down leg >> Jul 27, 2024  8:18 AM Jamie Kelly wrote: Jamie Kelly patient mother, did not want to hold, would like a nurse to call Patient back  phone [phone]  Reason for Disposition  [1] MODERATE pain (e.g., interferes with normal activities, limping) AND [2] present > 3 days  Answer Assessment - Initial Assessment Questions 1. LOCATION and RADIATION: Where is the pain located? Does the pain spread (shoot) anywhere else?     Left hip and down the front of the leg. Sometimes the back of the thigh when sitting.  2. QUALITY: What does  the pain feel like?  (e.g., sharp, dull, aching, burning)     Aching or shooting pain  3. SEVERITY: How bad is the pain? What does it keep you from doing?   (Scale 1-10; or mild, moderate, severe)     Was intermittent. Gets up to 10/10 with certain movements. None at rest. Now is constant  4. ONSET: When did the pain start? Does it come and go, or is it there all the time?     Several weeks ago  5. WORK OR EXERCISE: Has there been any recent work or exercise that involved this part of the body?      Denies  6. CAUSE: What do you think is causing the hip pain?      Unsure  7. AGGRAVATING FACTORS: What makes the hip pain worse? (e.g., walking, climbing stairs, running)     Laying down flat improves pain. Swinging foot up to put sock on and having knee bent for extended period makes it worse  8. OTHER SYMPTOMS: Do you have any other symptoms? (e.g., back pain, pain shooting down leg,  fever, rash)     Denies  Protocols used: Hip Pain-A-AH

## 2024-08-01 ENCOUNTER — Ambulatory Visit

## 2024-08-01 ENCOUNTER — Ambulatory Visit: Payer: Self-pay | Admitting: Family Medicine

## 2024-08-01 ENCOUNTER — Ambulatory Visit (INDEPENDENT_AMBULATORY_CARE_PROVIDER_SITE_OTHER): Admitting: Family Medicine

## 2024-08-01 VITALS — BP 140/80 | HR 80 | Ht 72.0 in | Wt 327.0 lb

## 2024-08-01 DIAGNOSIS — Z7689 Persons encountering health services in other specified circumstances: Secondary | ICD-10-CM | POA: Diagnosis not present

## 2024-08-01 DIAGNOSIS — M25552 Pain in left hip: Secondary | ICD-10-CM | POA: Insufficient documentation

## 2024-08-01 DIAGNOSIS — Z23 Encounter for immunization: Secondary | ICD-10-CM | POA: Insufficient documentation

## 2024-08-01 MED ORDER — NAPROXEN 500 MG PO TABS: 500.0000 mg | ORAL_TABLET | Freq: Two times a day (BID) | ORAL | 0 refills | Status: AC

## 2024-08-01 NOTE — Progress Notes (Signed)
 "  New Patient Office Visit  Subjective    Patient ID: Jamie Kelly, male    DOB: [birth year]  Age: 23 y.o. MRN: [id number]  CC:  Chief Complaint  Patient presents with   Establish Care    Left hip pain onset 3 weeks, pt lift heavy things for work    HPI Jamie Kelly presents to establish care with this practice. He is new to me.. Left hip pain 3 weeks ago. Intermittent at first. Constant pain for one week. Sitting in car makes it worse. No injury in last year.  Gait: guarded, steady ROM: intact Clicking, catching, or locking: denies   Adduction: intact Abduction: intact Palpation over greater trochanter: some tenderness  Straight leg raise: negative  Bony tenderness spinous processes: none   Red flags: fever, inability to bear weight, severe night pain, systemic symptoms: denies. Weight bearing does not affect it much. Walking helps. Standing and sitting irritates it.  No changes in bowel or bladder function. Will wait for ortho referral.    Outpatient Encounter Medications as of 08/01/2024  Medication Sig   ibuprofen  (ADVIL ) 800 MG tablet Take 1 tablet (800 mg total) by mouth every 8 (eight) hours as needed for headache.   naproxen  (NAPROSYN ) 500 MG tablet Take 1 tablet (500 mg total) by mouth 2 (two) times daily with a meal.   [DISCONTINUED] amoxicillin -clavulanate (AUGMENTIN ) 875-125 MG tablet Take 1 tablet by mouth 2 (two) times daily.   [DISCONTINUED] predniSONE  (DELTASONE ) 50 MG tablet Take 50mg  PO x5 days   No facility-administered encounter medications on file as of 08/01/2024.    Past Medical History:  Diagnosis Date   Migraines     Past Surgical History:  Procedure Laterality Date   HYPOSPADIAS CORRECTION      Family History  Problem Relation Age of Onset   Healthy Mother    Healthy Father    Diabetes Maternal Grandmother     Social History   Socioeconomic History   Marital status: Single    Spouse name: Not on file   Number of children:  Not on file   Years of education: Not on file   Highest education level: Associate degree: occupational, scientist, product/process development, or vocational program  Occupational History   Not on file  Tobacco Use   Smoking status: Never   Smokeless tobacco: Never  Vaping Use   Vaping status: Never Used  Substance and Sexual Activity   Alcohol use: Yes    Alcohol/week: 4.0 standard drinks of alcohol    Types: 3 Cans of beer, 1 Shots of liquor per week    Comment: occasionally   Drug use: Never   Sexual activity: Yes    Birth control/protection: None  Other Topics Concern   Not on file  Social History Narrative   Not on file   Social Drivers of Health   Tobacco Use: Low Risk (08/01/2024)   Patient History    Smoking Tobacco Use: Never    Smokeless Tobacco Use: Never    Passive Exposure: Not on file  Financial Resource Strain: Low Risk (08/01/2024)   Overall Financial Resource Strain (CARDIA)    Difficulty of Paying Living Expenses: Not very hard  Food Insecurity: No Food Insecurity (08/01/2024)   Epic    Worried About Radiation Protection Practitioner of Food in the Last Year: Never true    Ran Out of Food in the Last Year: Never true  Transportation Needs: No Transportation Needs (08/01/2024)   Epic    Lack of Transportation (Medical):  No    Lack of Transportation (Non-Medical): No  Physical Activity: Inactive (08/01/2024)   Exercise Vital Sign    Days of Exercise per Week: 0 days    Minutes of Exercise per Session: Not on file  Stress: No Stress Concern Present (08/01/2024)   [name]    Feeling of Stress: Not at all  Social Connections: Moderately Isolated (08/01/2024)   Social Connection and Isolation Panel    Frequency of Communication with Friends and Family: More than three times a week    Frequency of Social Gatherings with Friends and Family: More than three times a week    Attends Religious Services: 1 to 4 times per year    Active  Member of [employer] or Organizations: No    Attends Engineer, Structural: Not on file    Marital Status: Never married  Intimate Partner Violence: Not on file  Depression (PHQ2-9): Low Risk (08/01/2024)   Depression (PHQ2-9)    PHQ-2 Score: 1  Alcohol Screen: Low Risk (08/01/2024)   Alcohol Screen    Last Alcohol Screening Score (AUDIT): 4  Housing: Low Risk (08/01/2024)   Epic    Unable to Pay for Housing in the Last Year: No    Number of Times Moved in the Last Year: 0    Homeless in the Last Year: No  Utilities: Not on file  Health Literacy: Not on file    ROS      Objective    BP (!) 140/80   Pulse 80   Ht 6' (1.829 m)   Wt (!) 327 lb (148.3 kg)   SpO2 98%   BMI 44.35 kg/m   Physical Exam Vitals and nursing note reviewed.  Constitutional:      General: He is not in acute distress.    Appearance: Normal appearance.  Pulmonary:     Effort: Pulmonary effort is normal.  Musculoskeletal:     Left hip: Bony tenderness present.     Comments: Tenderness in left hip and pelvis.  ROM intact.  Normal gait- guarded and slow.  Normal coordination. ROM normal. Abduction and adduction on left intact.  5/5 upper and lower extremity strength. Negative straight leg raise. No bony spinous process tenderness. No loss of sensation. No rash.    Skin:    General: Skin is warm and dry.  Neurological:     General: No focal deficit present.     Mental Status: He is alert. Mental status is at baseline.  Psychiatric:        Mood and Affect: Mood normal.        Behavior: Behavior normal.        Thought Content: Thought content normal.        Judgment: Judgment normal.         Assessment & Plan:   Problem List Items Addressed This Visit     Encounter to establish care - Primary   Left hip pain   Left hip pain 3 weeks ago. Intermittent at first. Constant pain for one week. Sitting in car makes it worse. No injury in last year.  Gait: guarded, steady ROM:  intact Clicking, catching, or locking: denies   Adduction: intact Abduction: intact Palpation over greater trochanter: some tenderness  Straight leg raise: negative  Bony tenderness spinous processes: none   Red flags: fever, inability to bear weight, severe night pain, systemic symptoms: denies. Weight bearing does not affect it much. Walking helps.  Standing and sitting irritates it.  No changes in bowel or bladder function. Will wait for ortho referral.  No red flag symptoms today.  X-ray of left hip and pelvis today. Naproxen  500 mg BID with food. Will wait for ortho referral.       Relevant Medications   naproxen  (NAPROSYN ) 500 MG tablet   Other Relevant Orders   DG Hip Unilat W OR W/O Pelvis 2-3 Views Left  Agrees with plan of care discussed.  Questions answered.   No follow-ups on file.   Darice JONELLE Brownie, FNP   "

## 2024-08-01 NOTE — Patient Instructions (Signed)
 [facility]  [address]  The radiology department is on the first floor which is best accessed by going around to the back of the building. No appointment necessary. You can go at your convenience.

## 2024-08-01 NOTE — Assessment & Plan Note (Signed)
 Left hip pain 3 weeks ago. Intermittent at first. Constant pain for one week. Sitting in car makes it worse. No injury in last year.  Gait: guarded, steady ROM: intact Clicking, catching, or locking: denies   Adduction: intact Abduction: intact Palpation over greater trochanter: some tenderness  Straight leg raise: negative  Bony tenderness spinous processes: none   Red flags: fever, inability to bear weight, severe night pain, systemic symptoms: denies. Weight bearing does not affect it much. Walking helps. Standing and sitting irritates it.  No changes in bowel or bladder function. Will wait for ortho referral.  No red flag symptoms today.  X-ray of left hip and pelvis today. Naproxen  500 mg BID with food. Will wait for ortho referral.
